# Patient Record
Sex: Male | Born: 1965 | Race: White | Hispanic: No | Marital: Married | Smoking: Current every day smoker
Health system: Southern US, Community
[De-identification: ages and names within clinical notes are randomized; demographics above are authoritative.]

## PROBLEM LIST (undated history)

## (undated) DIAGNOSIS — N200 Calculus of kidney: Secondary | ICD-10-CM

## (undated) HISTORY — PX: OTHER SURGICAL HISTORY: SHX169

## (undated) HISTORY — PX: KNEE ARTHROSCOPY: SUR90

---

## 2018-01-30 ENCOUNTER — Emergency Department (HOSPITAL_COMMUNITY): Payer: Self-pay

## 2018-01-30 ENCOUNTER — Emergency Department (HOSPITAL_COMMUNITY)
Admission: EM | Admit: 2018-01-30 | Discharge: 2018-01-30 | Disposition: A | Discharge status: Home / Self Care | Payer: Self-pay | Attending: Emergency Medicine | Admitting: Emergency Medicine

## 2018-01-30 ENCOUNTER — Encounter (HOSPITAL_COMMUNITY): Payer: Self-pay | Admitting: Emergency Medicine

## 2018-01-30 DIAGNOSIS — F17200 Nicotine dependence, unspecified, uncomplicated: Secondary | ICD-10-CM | POA: Insufficient documentation

## 2018-01-30 DIAGNOSIS — R109 Unspecified abdominal pain: Secondary | ICD-10-CM

## 2018-01-30 DIAGNOSIS — N281 Cyst of kidney, acquired: Secondary | ICD-10-CM | POA: Insufficient documentation

## 2018-01-30 HISTORY — DX: Calculus of kidney: N20.0

## 2018-01-30 LAB — URINALYSIS, ROUTINE W REFLEX MICROSCOPIC
Bacteria, UA: NONE SEEN
Bilirubin Urine: NEGATIVE
Glucose, UA: NEGATIVE mg/dL
KETONES UR: NEGATIVE mg/dL
NITRITE: NEGATIVE
PROTEIN: NEGATIVE mg/dL
Specific Gravity, Urine: 1.006 (ref 1.005–1.030)
pH: 6 (ref 5.0–8.0)

## 2018-01-30 LAB — BASIC METABOLIC PANEL
ANION GAP: 6 (ref 5–15)
BUN: 17 mg/dL (ref 6–20)
CALCIUM: 8.7 mg/dL — AB (ref 8.9–10.3)
CO2: 27 mmol/L (ref 22–32)
Chloride: 103 mmol/L (ref 98–111)
Creatinine, Ser: 1.18 mg/dL (ref 0.61–1.24)
GFR calc Af Amer: >60 mL/min (ref >60)
GFR calc non Af Amer: >60 mL/min (ref >60)
GLUCOSE: 108 mg/dL — AB (ref 70–99)
Potassium: 3.7 mmol/L (ref 3.5–5.1)
Sodium: 136 mmol/L (ref 135–145)

## 2018-01-30 LAB — CBC WITH DIFFERENTIAL/PLATELET
ABS IMMATURE GRANULOCYTES: 0 10*3/uL (ref 0.0–0.1)
Basophils Absolute: 0.1 10*3/uL (ref 0.0–0.1)
Basophils Relative: 1 %
Eosinophils Absolute: 0.3 10*3/uL (ref 0.0–0.7)
Eosinophils Relative: 4 %
HEMATOCRIT: 43.5 % (ref 39.0–52.0)
Hemoglobin: 13.8 g/dL (ref 13.0–17.0)
Immature Granulocytes: 0 %
LYMPHS ABS: 2.6 10*3/uL (ref 0.7–4.0)
LYMPHS PCT: 30 %
MCH: 29.9 pg (ref 26.0–34.0)
MCHC: 31.7 g/dL (ref 30.0–36.0)
MCV: 94.4 fL (ref 78.0–100.0)
MONO ABS: 0.7 10*3/uL (ref 0.1–1.0)
MONOS PCT: 8 %
NEUTROS ABS: 5 10*3/uL (ref 1.7–7.7)
Neutrophils Relative %: 57 %
Platelets: 310 10*3/uL (ref 150–400)
RBC: 4.61 MIL/uL (ref 4.22–5.81)
RDW: 12.8 % (ref 11.5–15.5)
WBC: 8.8 10*3/uL (ref 4.0–10.5)

## 2018-01-30 MED ORDER — FENTANYL CITRATE (PF) 100 MCG/2ML IJ SOLN
50.0000 ug | Freq: Once | INTRAMUSCULAR | Status: AC
  Administered 2018-01-30: 50 ug via INTRAVENOUS
  Filled 2018-01-30: qty 2

## 2018-01-30 MED ORDER — KETOROLAC TROMETHAMINE 30 MG/ML IJ SOLN
30.0000 mg | Freq: Once | INTRAMUSCULAR | Status: AC
  Administered 2018-01-30: 30 mg via INTRAVENOUS
  Filled 2018-01-30: qty 1

## 2018-01-30 MED ORDER — METHOCARBAMOL 500 MG PO TABS: 500.0000 mg | ORAL_TABLET | Freq: Two times a day (BID) | ORAL | 0 refills | Status: AC

## 2018-01-30 MED ORDER — HYDROCODONE-ACETAMINOPHEN 5-325 MG PO TABS: 1.0000 | ORAL_TABLET | Freq: Four times a day (QID) | ORAL | 0 refills | Status: AC | PRN

## 2018-01-30 MED ORDER — ONDANSETRON 4 MG PO TBDP
4.0000 mg | ORAL_TABLET | Freq: Once | ORAL | Status: AC
  Administered 2018-01-30: 4 mg via ORAL
  Filled 2018-01-30: qty 1

## 2018-01-30 MED ORDER — OXYCODONE-ACETAMINOPHEN 5-325 MG PO TABS
1.0000 | ORAL_TABLET | ORAL | Status: DC | PRN
  Administered 2018-01-30: 1 via ORAL
  Filled 2018-01-30: qty 1

## 2018-01-30 NOTE — ED Notes (Signed)
Signature pad not available. Pt discharge verbalized understanding of discharge instructions and all questions were answered.

## 2018-01-30 NOTE — ED Notes (Signed)
Patient transported to CT 

## 2018-01-30 NOTE — ED Provider Notes (Signed)
[facility] EMERGENCY DEPARTMENT Provider Note   CSN: [id number] Arrival date & time: 01/30/18  1919     History   Chief Complaint Chief Complaint  Patient presents with  . Flank Pain    HPI Joshua Burgess is a 52 y.o. male has some history of kidney stones who presents for evaluation of left flank pain that began yesterday.  Patient states that the pain waxes and wanes in intensity.  He took tylenol and ibuprofen with minimal improvement. Patient reports that it slightly radiates forward but states he is not having any abdominal pain.  Reports associated nausea but no vomiting.  Patient states that he has noticed that he is urinated less frequently since onset of symptoms.  Denies any dysuria hematuria.  He does report he has a history of kidney stones and states symptoms feel similar to when he had previous kidney stones.  He states that they never required surgical intervention.  Patient denies any fevers, chest pain, difficulty breathing, abdominal pain.  The history is provided by the patient.    Past Medical History:  Diagnosis Date  . Kidney stones     There are no active problems to display for this patient.   Past Surgical History:  Procedure Laterality Date  . KNEE ARTHROSCOPY    . shouder          Home Medications    Prior to Admission medications   Medication Sig Start Date End Date Taking? Authorizing Provider  HYDROcodone-acetaminophen (NORCO/VICODIN) 5-325 MG tablet Take 1-2 tablets by mouth every 6 (six) hours as needed. 01/30/18   Maxwell Caul, PA-C  methocarbamol (ROBAXIN) 500 MG tablet Take 1 tablet (500 mg total) by mouth 2 (two) times daily. 01/30/18   Maxwell Caul, PA-C    Family History No family history on file.  Social History Social History   Tobacco Use  . Smoking status: Current Every Day Smoker  . Smokeless tobacco: Former Neurosurgeon    Types: Snuff  Substance Use Topics  . Alcohol use: Never    Frequency: Never  .  Drug use: Never     Allergies   Patient has no allergy information on record.   Review of Systems Review of Systems  Constitutional: Negative for fever.  Respiratory: Negative for shortness of breath.   Cardiovascular: Negative for chest pain.  Gastrointestinal: Negative for abdominal pain, nausea and vomiting.  Genitourinary: Positive for flank pain. Negative for dysuria and hematuria.  Neurological: Positive for headaches.  All other systems reviewed and are negative.    Physical Exam Updated Vital Signs BP 118/77 (BP Location: Right Arm)   Pulse 78   Temp 98.1 F (36.7 C) (Oral)   Resp 20   Ht 5\' 11"  (1.803 m)   Wt 86.2 kg (190 lb)   SpO2 100%   BMI 26.50 kg/m   Physical Exam  Constitutional: He is oriented to person, place, and time. He appears well-developed and well-nourished.  Appears uncomfortable. Pacing in the hallway  HENT:  Head: Normocephalic and atraumatic.  Mouth/Throat: Oropharynx is clear and moist and mucous membranes are normal.  Eyes: Pupils are equal, round, and reactive to light. Conjunctivae, EOM and lids are normal.  Neck: Full passive range of motion without pain.  Cardiovascular: Normal rate, regular rhythm, normal heart sounds and normal pulses. Exam reveals no gallop and no friction rub.  No murmur heard. Pulmonary/Chest: Effort normal and breath sounds normal.  Abdominal: Soft. Normal appearance. There is no tenderness.  There is CVA tenderness (Left). There is no rigidity and no guarding.  Abdomen soft, nondistended.  Left-sided CVA tenderness  Musculoskeletal: Normal range of motion.  Neurological: He is alert and oriented to person, place, and time.  Skin: Skin is warm and dry. Capillary refill takes less than 2 seconds.  Psychiatric: He has a normal mood and affect. His speech is normal.  Nursing note and vitals reviewed.    ED Treatments / Results  Labs (all labs ordered are listed, but only abnormal results are displayed) Labs  Reviewed  URINALYSIS, ROUTINE W REFLEX MICROSCOPIC - Abnormal; Notable for the following components:      Result Value   Hgb urine dipstick MODERATE (*)    Leukocytes, UA TRACE (*)    All other components within normal limits  BASIC METABOLIC PANEL - Abnormal; Notable for the following components:   Glucose, Bld 108 (*)    Calcium 8.7 (*)    All other components within normal limits  CBC WITH DIFFERENTIAL/PLATELET    EKG None  Radiology Ct Renal Stone Study  Result Date: 01/30/2018 CLINICAL DATA:  LEFT flank pain and oliguria since yesterday, hematuria, history kidney stones EXAM: CT ABDOMEN AND PELVIS WITHOUT CONTRAST TECHNIQUE: Multidetector CT imaging of the abdomen and pelvis was performed following the standard protocol without IV contrast. Sagittal and coronal MPR images reconstructed from axial data set. Oral contrast not administered COMPARISON:  None FINDINGS: Lower chest: Lung bases clear Hepatobiliary: Contracted gallbladder.  Liver unremarkable. Pancreas: Normal appearance Spleen: Normal appearance.  Small splenule at splenic hilum. Adrenals/Urinary Tract: Adrenal glands normal appearance. LEFT renal cyst, 4.9 x 4.8 x 4.9 cm image 28. Kidneys, ureters and bladder otherwise normal appearance. No urinary tract calcification or dilatation. Stomach/Bowel: Normal appendix. Stool throughout colon to rectum. Stomach and bowel loops otherwise normal appearance. Vascular/Lymphatic: Aorta normal caliber with minimal atherosclerotic calcification. No adenopathy. Reproductive: Prostatic enlargement, gland 4.7 x 3.7 cm. Other: No free air or free fluid. No hernia or acute inflammatory process. Musculoskeletal: No acute osseous findings. IMPRESSION: 4.9 cm diameter LEFT renal cyst. Mild prostatic enlargement. Otherwise negative exam. Electronically Signed   By: Ulyses SouthwardMark  Boles M.D.   On: 01/30/2018 22:31    Procedures Procedures (including critical care time)  Medications Ordered in ED Medications   oxyCODONE-acetaminophen (PERCOCET/ROXICET) 5-325 MG per tablet 1 tablet (1 tablet Oral Given 01/30/18 2028)  ondansetron (ZOFRAN-ODT) disintegrating tablet 4 mg (4 mg Oral Given 01/30/18 2028)  fentaNYL (SUBLIMAZE) injection 50 mcg (50 mcg Intravenous Given 01/30/18 2204)  ketorolac (TORADOL) 30 MG/ML injection 30 mg (30 mg Intravenous Given 01/30/18 2204)     Initial Impression / Assessment and Plan / ED Course  I have reviewed the triage vital signs and the nursing notes.  Pertinent labs & imaging results that were available during my care of the patient were reviewed by me and considered in my medical decision making (see chart for details).     52 y.o. M with history of kidney stones who presents for evaluation of left flank pain that began yesterday.  Reports similar feeling to previous kidney stones.  No fevers, chest pain, difficulty breathing. Patient is afebrile, non-toxic appearing, sitting comfortably on examination table. Vital signs reviewed and stable.  On exam, patient with left-sided CVA tenderness.  Consider kidney stone.  Low suspicion for infectious etiology given history/physical exam.  History/physical exam is not concerning for aortic dissection.  We will plan to check basic labs, UA.  Analgesics provided.  BMP shows stable  BUN and creatinine.  CBC without any significant leukocytosis, anemia.  UA shows moderate hemoglobin.  With trace leukocytes.  Given hemoglobin seen, will plan for CT renal study for evaluation of kidney stone.  CT renal study shows a left renal cyst that is 4 x 4 cm.  Otherwise unremarkable.  No evidence of kidney stones.  Discussed results with patient.  He reports improvement in pain and analgesics.  I instructed him that he will need to follow-up with outpatient urology for further evaluation.  Patient is currently traveling from [place].  He will return home in days.  Instructed in either follow-up with our urologist as directed or follow-up with one once  he gets home.  Patient reviewed on PMP.  No recent prescriptions though he is from out of town.  I discussed with him I can give him a short course of pain medication but will not give any extensive long prescriptions.  Patient agreeable.  Vital signs are stable.  Patient stable for discharge at this time. Patient had ample opportunity for questions and discussion. All patient's questions were answered with full understanding. Strict return precautions discussed. Patient expresses understanding and agreement to plan.     Final Clinical Impressions(s) / ED Diagnoses   Final diagnoses:  Flank pain  Renal cyst    ED Discharge Orders        Ordered    methocarbamol (ROBAXIN) 500 MG tablet  2 times daily     01/30/18 2311    HYDROcodone-acetaminophen (NORCO/VICODIN) 5-325 MG tablet  Every 6 hours PRN     01/30/18 2311       Rosana Hoes 01/31/18 0015    Bethann Berkshire, MD 02/01/18 1028

## 2018-01-30 NOTE — ED Triage Notes (Signed)
Reports left flank pain and oliguria since yesterday.  Hx of kidney stones.  Taking ibuprofen and tylenol with little relief.

## 2018-01-30 NOTE — Discharge Instructions (Signed)
You can take Tylenol or Ibuprofen as directed for pain. You can alternate Tylenol and Ibuprofen every 4 hours. If you take Tylenol at 1pm, then you can take Ibuprofen at 5pm. Then you can take Tylenol again at 9pm.   Take Robaxin as prescribed. This medication will make you drowsy so do not drive or drink alcohol when taking it.  As we discussed, today your CT scan showed a renal cyst.  This needs to be reevaluated by urology.  Please follow-up with the urologist either here or when you get home.  Return the emergency department for any worsening pain, fever, vomiting or any other worsening or concerning symptoms.

## 2019-06-18 IMAGING — CT CT RENAL STONE PROTOCOL
2 of 4 series · 17 of 46 positions shown, 19 images · non-contrast
Comparison: None

CLINICAL DATA: LEFT flank pain and oliguria since yesterday,
hematuria, history kidney stones

EXAM:
CT ABDOMEN AND PELVIS WITHOUT CONTRAST
TECHNIQUE: Multidetector CT imaging of the abdomen and pelvis was performed
following the standard protocol without IV contrast. Sagittal and
coronal MPR images reconstructed from axial data set. Oral contrast
not administered

[Series 3: stone study 5.0 i30f 2 · axial · 0.82mm/px · z∈[+838,+1268]mm · 14 of 94 slices shown, 16 images]
[im 4/94  soft-tissue]
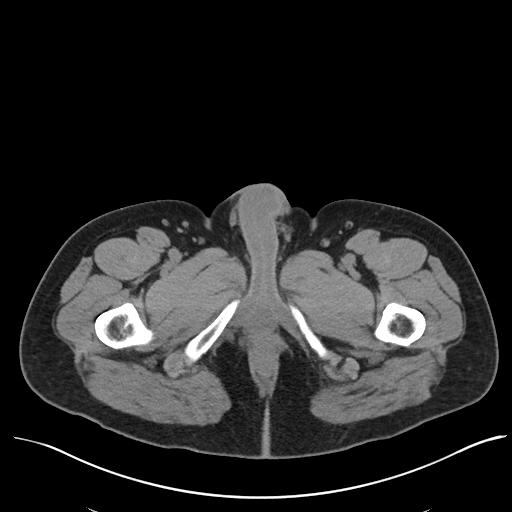
[im 4/94  bone]
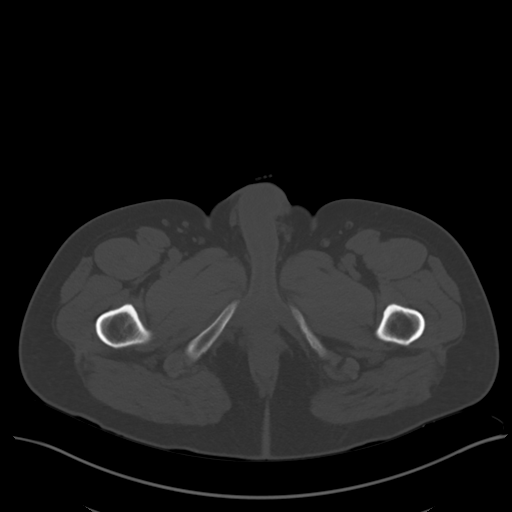
[im 12/94  soft-tissue]
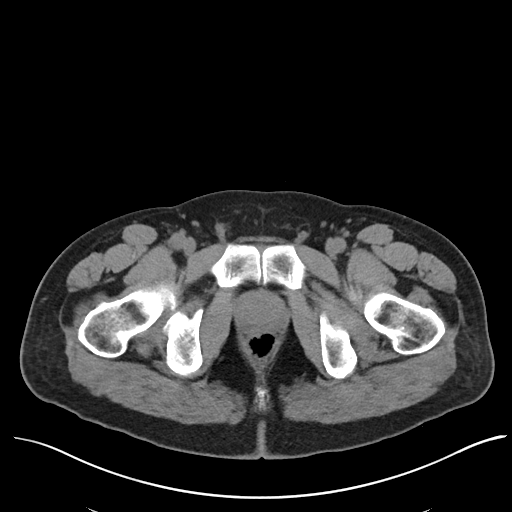
[im 20/94  soft-tissue]
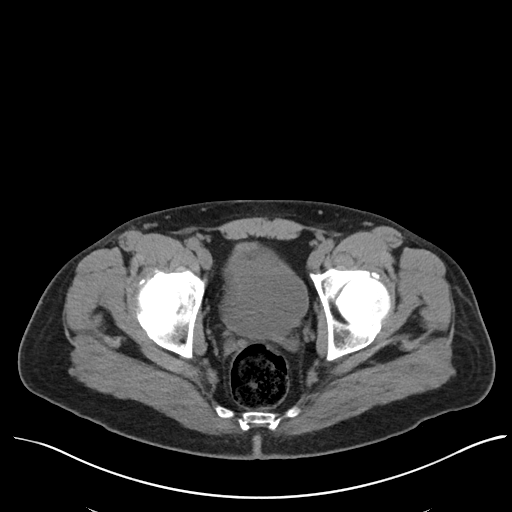
[im 24/94  soft-tissue]
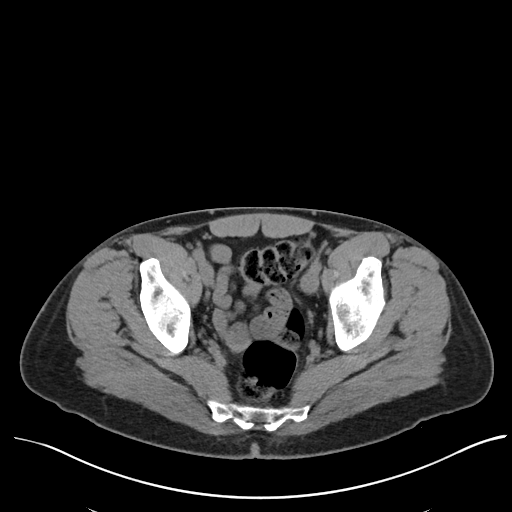
[im 32/94  soft-tissue]
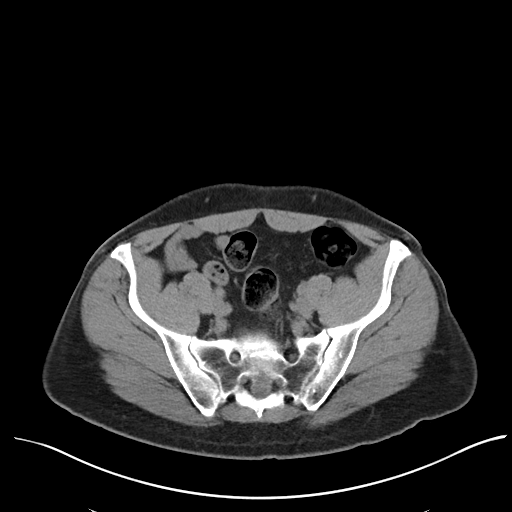
[im 39/94  soft-tissue]
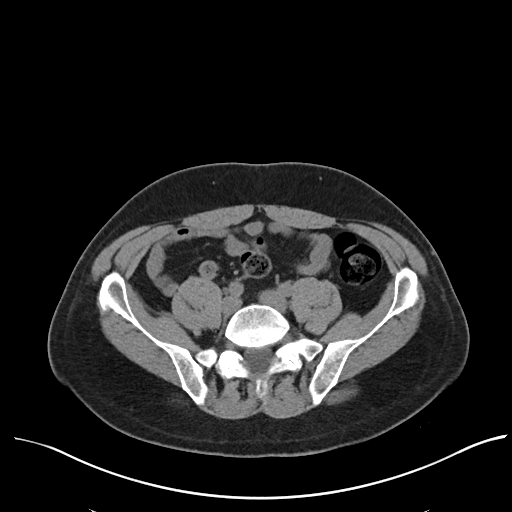
[im 43/94  soft-tissue]
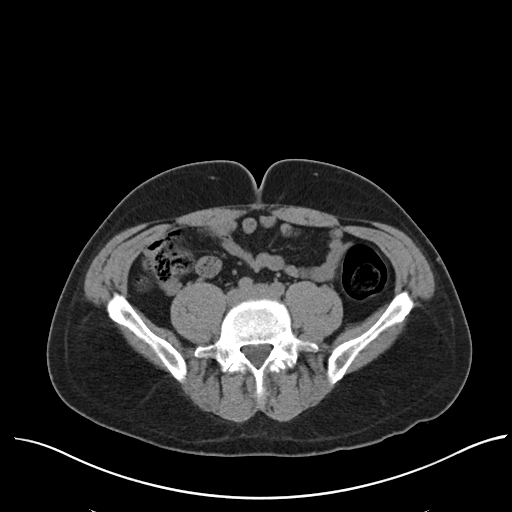
[im 51/94  soft-tissue]
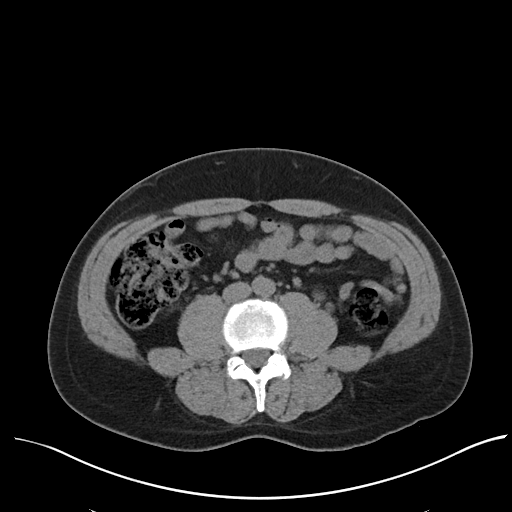
[im 55/94  soft-tissue]
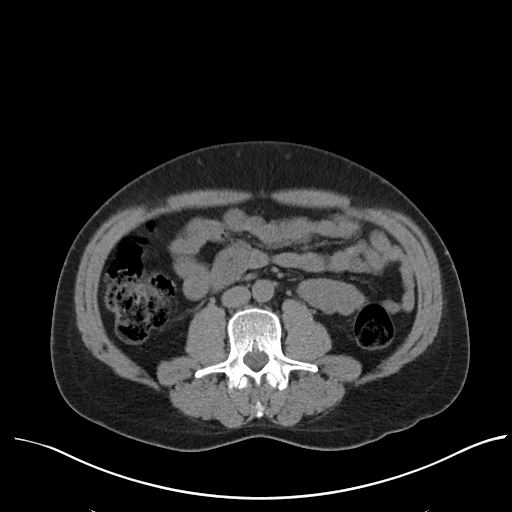
[im 55/94  bone]
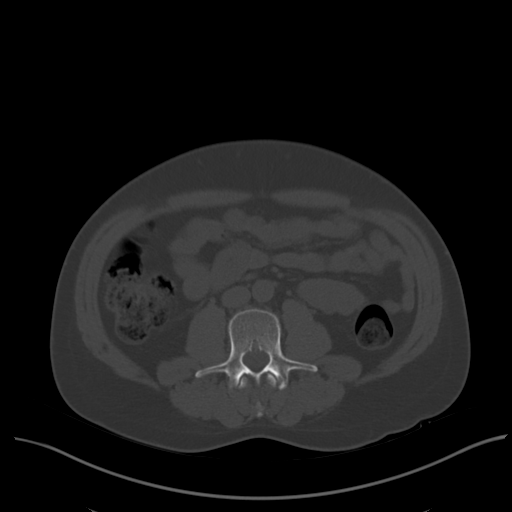
[im 63/94  soft-tissue]
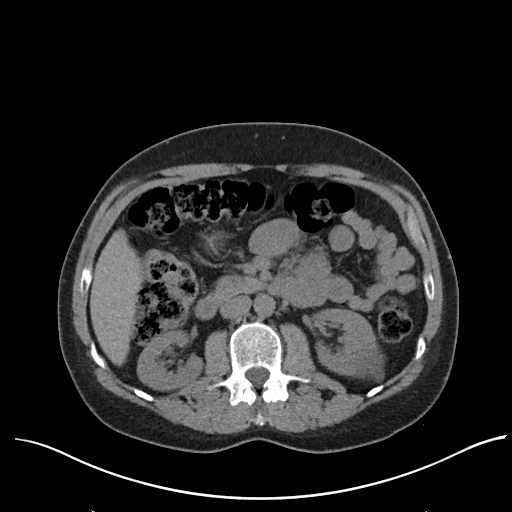
[im 70/94  soft-tissue]
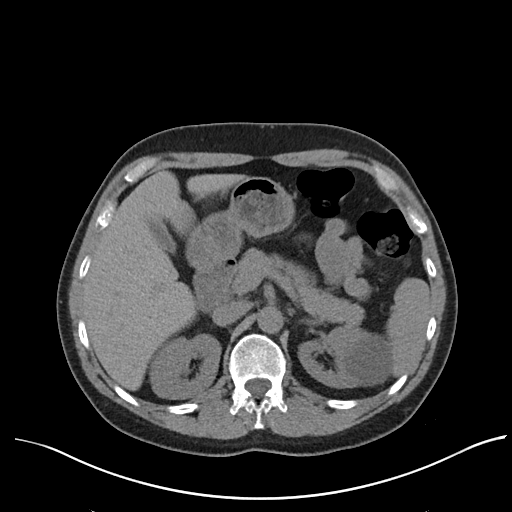
[im 74/94  soft-tissue]
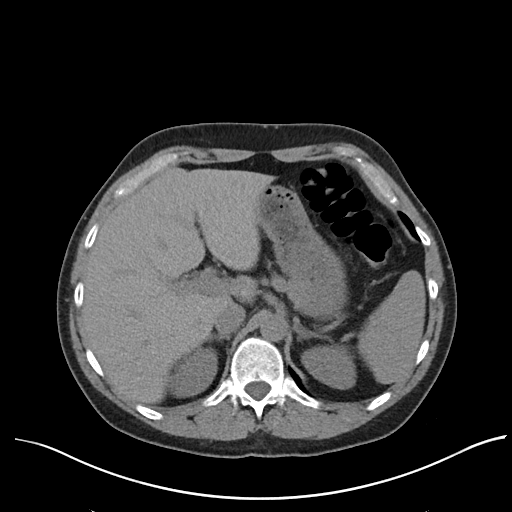
[im 82/94  soft-tissue]
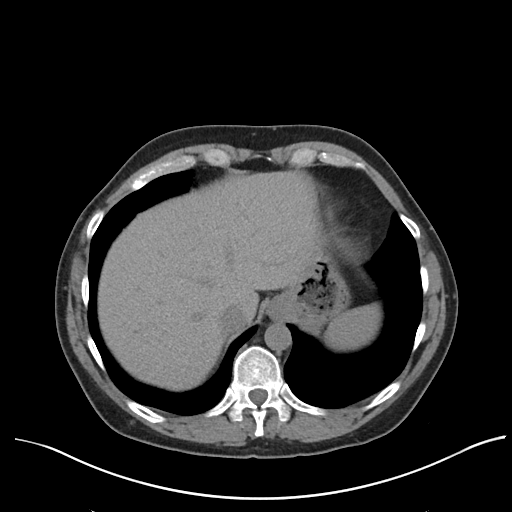
[im 90/94  soft-tissue]
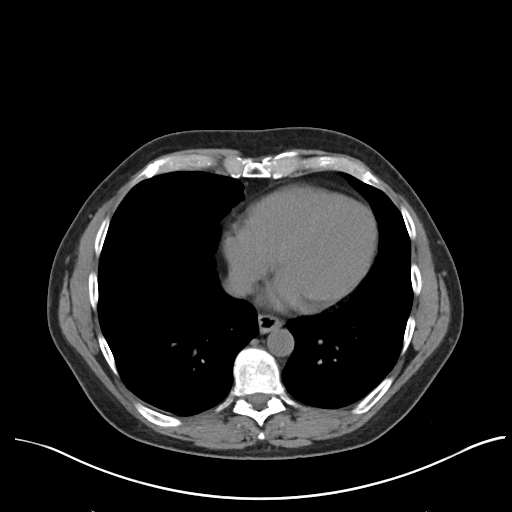

[Series 6: coronal soft tissue · coronal · 0.77mm/px · 3 of 101 slices shown]
[im 34/101  soft-tissue]
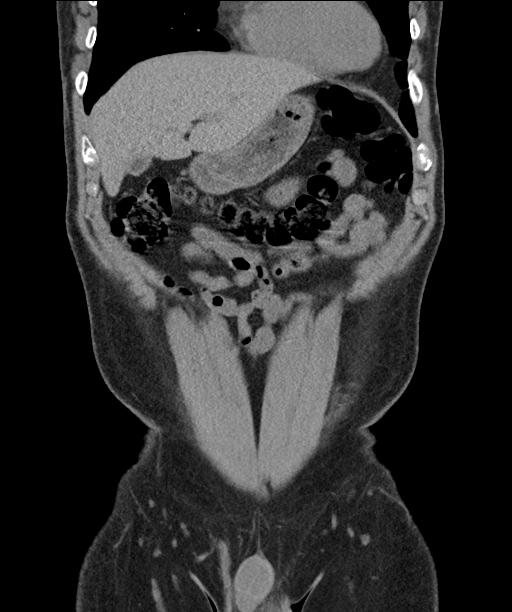
[im 45/101  soft-tissue]
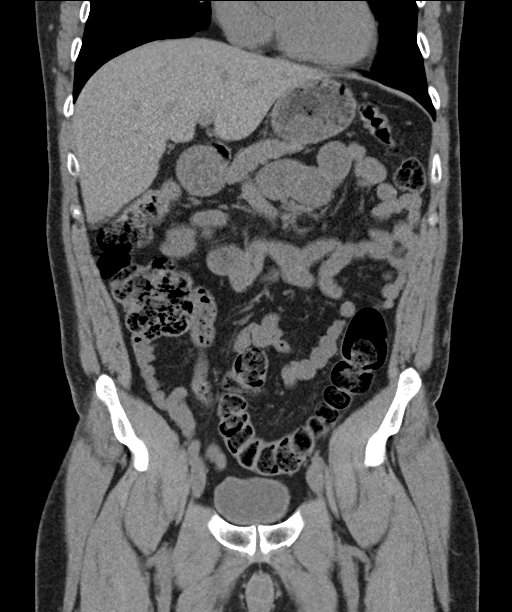
[im 56/101  soft-tissue]
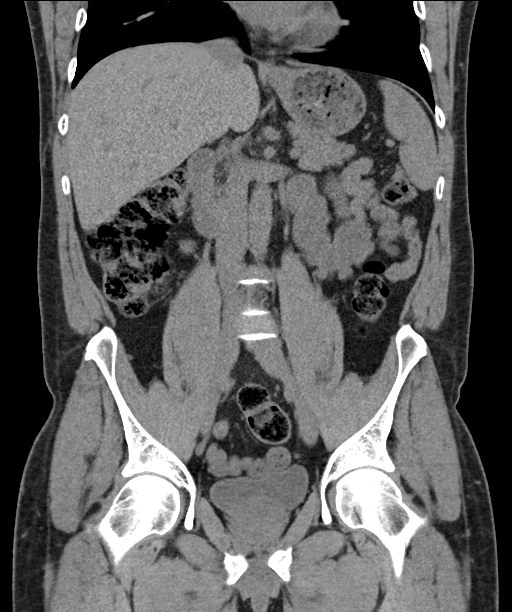

[17 of 46 positions shown; findings below may reference images not displayed]

FINDINGS: Lower chest: Lung bases clear

Hepatobiliary: Contracted gallbladder.  Liver unremarkable.

Pancreas: Normal appearance

Spleen: Normal appearance.  Small splenule at splenic hilum.

Adrenals/Urinary Tract: Adrenal glands normal appearance. LEFT renal
cyst, 4.9 x 4.8 x 4.9 cm image 28. Kidneys, ureters and bladder
otherwise normal appearance. No urinary tract calcification or
dilatation.

Stomach/Bowel: Normal appendix. Stool throughout colon to rectum.
Stomach and bowel loops otherwise normal appearance.

Vascular/Lymphatic: Aorta normal caliber with minimal
atherosclerotic calcification. No adenopathy.

Reproductive: Prostatic enlargement, gland 4.7 x 3.7 cm.

Other: No free air or free fluid. No hernia or acute inflammatory
process.

Musculoskeletal: No acute osseous findings.
IMPRESSION: 4.9 cm diameter LEFT renal cyst.

Mild prostatic enlargement.

Otherwise negative exam.
